# Patient Record
Sex: Female | Born: 1960
Health system: Southern US, Community
[De-identification: ages and names within clinical notes are randomized; demographics above are authoritative.]

---

## 2004-09-28 ENCOUNTER — Other Ambulatory Visit: Admission: RE | Admit: 2004-09-28 | Discharge: 2004-09-28 | Payer: Self-pay | Admitting: Family Medicine

## 2006-06-01 ENCOUNTER — Other Ambulatory Visit: Admission: RE | Admit: 2006-06-01 | Discharge: 2006-06-01 | Payer: Self-pay | Admitting: Family Medicine

## 2007-07-25 ENCOUNTER — Other Ambulatory Visit: Admission: RE | Admit: 2007-07-25 | Discharge: 2007-07-25 | Payer: Self-pay | Admitting: Family Medicine

## 2008-08-21 ENCOUNTER — Encounter: Admission: RE | Admit: 2008-08-21 | Discharge: 2008-08-21 | Payer: Self-pay | Admitting: Family Medicine

## 2008-09-01 ENCOUNTER — Encounter: Admission: RE | Admit: 2008-09-01 | Discharge: 2008-09-01 | Payer: Self-pay | Admitting: Family Medicine

## 2008-10-07 ENCOUNTER — Other Ambulatory Visit: Admission: RE | Admit: 2008-10-07 | Discharge: 2008-10-07 | Payer: Self-pay | Admitting: Family Medicine

## 2010-05-07 ENCOUNTER — Encounter: Admission: RE | Admit: 2010-05-07 | Discharge: 2010-05-07 | Payer: Self-pay | Admitting: Internal Medicine

## 2010-11-14 ENCOUNTER — Encounter: Payer: Self-pay | Admitting: Family Medicine

## 2011-07-22 ENCOUNTER — Other Ambulatory Visit: Payer: Self-pay | Admitting: Family Medicine

## 2011-07-22 DIAGNOSIS — Z1231 Encounter for screening mammogram for malignant neoplasm of breast: Secondary | ICD-10-CM

## 2011-08-05 ENCOUNTER — Ambulatory Visit
Admission: RE | Admit: 2011-08-05 | Discharge: 2011-08-05 | Disposition: A | Discharge status: Home / Self Care | Payer: BC Managed Care – PPO | Source: Ambulatory Visit | Attending: Family Medicine | Admitting: Family Medicine

## 2011-08-05 DIAGNOSIS — Z1231 Encounter for screening mammogram for malignant neoplasm of breast: Secondary | ICD-10-CM

## 2012-07-24 ENCOUNTER — Other Ambulatory Visit: Payer: Self-pay | Admitting: Family Medicine

## 2012-07-24 DIAGNOSIS — N644 Mastodynia: Secondary | ICD-10-CM

## 2012-08-06 ENCOUNTER — Ambulatory Visit
Admission: RE | Admit: 2012-08-06 | Discharge: 2012-08-06 | Disposition: A | Discharge status: Home / Self Care | Payer: BC Managed Care – PPO | Source: Ambulatory Visit | Attending: Family Medicine | Admitting: Family Medicine

## 2012-08-06 DIAGNOSIS — N644 Mastodynia: Secondary | ICD-10-CM

## 2013-07-04 ENCOUNTER — Other Ambulatory Visit: Payer: Self-pay | Admitting: Obstetrics and Gynecology

## 2013-07-04 ENCOUNTER — Other Ambulatory Visit (HOSPITAL_COMMUNITY)
Admission: RE | Admit: 2013-07-04 | Discharge: 2013-07-04 | Disposition: A | Discharge status: Home / Self Care | Payer: BC Managed Care – PPO | Source: Ambulatory Visit | Attending: Obstetrics and Gynecology | Admitting: Obstetrics and Gynecology

## 2013-07-04 DIAGNOSIS — Z01419 Encounter for gynecological examination (general) (routine) without abnormal findings: Secondary | ICD-10-CM | POA: Insufficient documentation

## 2013-07-04 DIAGNOSIS — Z1151 Encounter for screening for human papillomavirus (HPV): Secondary | ICD-10-CM | POA: Insufficient documentation

## 2013-07-05 ENCOUNTER — Other Ambulatory Visit: Payer: Self-pay | Admitting: Dermatology

## 2013-08-12 ENCOUNTER — Other Ambulatory Visit: Payer: Self-pay | Admitting: Obstetrics and Gynecology

## 2013-08-19 ENCOUNTER — Other Ambulatory Visit: Payer: Self-pay

## 2013-08-19 DIAGNOSIS — Z1231 Encounter for screening mammogram for malignant neoplasm of breast: Secondary | ICD-10-CM

## 2013-08-20 ENCOUNTER — Ambulatory Visit
Admission: RE | Admit: 2013-08-20 | Discharge: 2013-08-20 | Disposition: A | Discharge status: Home / Self Care | Payer: BC Managed Care – PPO | Source: Ambulatory Visit

## 2013-08-20 DIAGNOSIS — Z1231 Encounter for screening mammogram for malignant neoplasm of breast: Secondary | ICD-10-CM

## 2014-06-25 ENCOUNTER — Other Ambulatory Visit: Payer: Self-pay | Admitting: Family Medicine

## 2014-06-25 DIAGNOSIS — N63 Unspecified lump in unspecified breast: Secondary | ICD-10-CM

## 2014-07-03 ENCOUNTER — Ambulatory Visit
Admission: RE | Admit: 2014-07-03 | Discharge: 2014-07-03 | Disposition: A | Discharge status: Home / Self Care | Payer: BC Managed Care – PPO | Source: Ambulatory Visit | Attending: Family Medicine | Admitting: Family Medicine

## 2014-07-03 DIAGNOSIS — N63 Unspecified lump in unspecified breast: Secondary | ICD-10-CM

## 2015-06-09 ENCOUNTER — Emergency Department (HOSPITAL_COMMUNITY): Payer: BLUE CROSS/BLUE SHIELD

## 2015-06-09 ENCOUNTER — Encounter (HOSPITAL_COMMUNITY): Payer: Self-pay | Admitting: Emergency Medicine

## 2015-06-09 ENCOUNTER — Emergency Department (HOSPITAL_COMMUNITY)
Admission: EM | Admit: 2015-06-09 | Discharge: 2015-06-09 | Disposition: A | Discharge status: Home / Self Care | Payer: BLUE CROSS/BLUE SHIELD | Attending: Emergency Medicine | Admitting: Emergency Medicine

## 2015-06-09 DIAGNOSIS — R42 Dizziness and giddiness: Secondary | ICD-10-CM | POA: Diagnosis present

## 2015-06-09 DIAGNOSIS — G510 Bell's palsy: Secondary | ICD-10-CM | POA: Diagnosis not present

## 2015-06-09 LAB — CBC
HCT: 38.7 % (ref 36.0–46.0)
Hemoglobin: 12.8 g/dL (ref 12.0–15.0)
MCH: 33.3 pg (ref 26.0–34.0)
MCHC: 33.1 g/dL (ref 30.0–36.0)
MCV: 100.8 fL — ABNORMAL HIGH (ref 78.0–100.0)
Platelets: 248 10*3/uL (ref 150–400)
RBC: 3.84 MIL/uL — ABNORMAL LOW (ref 3.87–5.11)
RDW: 14.3 % (ref 11.5–15.5)
WBC: 4.4 10*3/uL (ref 4.0–10.5)

## 2015-06-09 LAB — COMPREHENSIVE METABOLIC PANEL
ALT: 22 U/L (ref 14–54)
AST: 34 U/L (ref 15–41)
Albumin: 4.2 g/dL (ref 3.5–5.0)
Alkaline Phosphatase: 43 U/L (ref 38–126)
Anion gap: 5 (ref 5–15)
BUN: 18 mg/dL (ref 6–20)
CO2: 31 mmol/L (ref 22–32)
Calcium: 9.1 mg/dL (ref 8.9–10.3)
Chloride: 102 mmol/L (ref 101–111)
Creatinine, Ser: 0.78 mg/dL (ref 0.44–1.00)
GFR calc Af Amer: >60 mL/min (ref >60)
GFR calc non Af Amer: >60 mL/min (ref >60)
Glucose, Bld: 90 mg/dL (ref 65–99)
Potassium: 3.8 mmol/L (ref 3.5–5.1)
Sodium: 138 mmol/L (ref 135–145)
Total Bilirubin: 0.9 mg/dL (ref 0.3–1.2)
Total Protein: 6.5 g/dL (ref 6.5–8.1)

## 2015-06-09 LAB — URINALYSIS, ROUTINE W REFLEX MICROSCOPIC
Bilirubin Urine: NEGATIVE
Glucose, UA: NEGATIVE mg/dL
Hgb urine dipstick: NEGATIVE
Ketones, ur: NEGATIVE mg/dL
Leukocytes, UA: NEGATIVE
Nitrite: NEGATIVE
Protein, ur: NEGATIVE mg/dL
Specific Gravity, Urine: 1.004 — ABNORMAL LOW (ref 1.005–1.030)
Urobilinogen, UA: 0.2 mg/dL (ref 0.0–1.0)
pH: 6 (ref 5.0–8.0)

## 2015-06-09 LAB — CBG MONITORING, ED: Glucose-Capillary: 62 mg/dL — ABNORMAL LOW (ref 65–99)

## 2015-06-09 NOTE — ED Notes (Signed)
Pt sts that she has had some vertigo for over a week and woke this am and had some droopiness over her R eye. Pt has not neuro deficients. Pt does have slight droop to her R smile and eye that stated last week. Pt sts that she woke this am with a HA. Pt also has some neck pain. No known trauma. Equal R=L grip and strength R = L.

## 2015-06-09 NOTE — Discharge Instructions (Signed)
Bell's Palsy °Bell's palsy is a condition in which the muscles on one side of the face cannot move (paralysis). This is because the nerves in the face are paralyzed. It is most often thought to be caused by a virus. The virus causes swelling of the nerve that controls movement on one side of the face. The nerve travels through a tight space surrounded by bone. When the nerve swells, it can be compressed by the bone. This results in damage to the protective covering around the nerve. This damage interferes with how the nerve communicates with the muscles of the face. As a result, it can cause weakness or paralysis of the facial muscles.  °Injury (trauma), tumor, and surgery may cause Bell's palsy, but most of the time the cause is unknown. It is a relatively common condition. It starts suddenly (abrupt onset) with the paralysis usually ending within 2 days. Bell's palsy is not dangerous. But because the eye does not close properly, you may need care to keep the eye from getting dry. This can include splinting (to keep the eye shut) or moistening with artificial tears. Bell's palsy very seldom occurs on both sides of the face at the same time. °SYMPTOMS  °· Eyebrow sagging. °· Drooping of the eyelid and corner of the mouth. °· Inability to close one eye. °· Loss of taste on the front of the tongue. °· Sensitivity to loud noises. °TREATMENT  °The treatment is usually non-surgical. If the patient is seen within the first 24 to 48 hours, a short course of steroids may be prescribed, in an attempt to shorten the length of the condition. Antiviral medicines may also be used with the steroids, but it is unclear if they are helpful.  °You will need to protect your eye, if you cannot close it. The cornea (clear covering over your eye) will become dry and can be damaged. Artificial tears can be used to keep your eye moist. Glasses or an eye patch should be worn to protect your eye. °PROGNOSIS  °Recovery is variable, ranging  from days to months. Although the problem usually goes away completely (about 80% of cases resolve), predicting the outcome is impossible. Most people improve within 3 weeks of when the symptoms began. Improvement may continue for 3 to 6 months. A small number of people have moderate to severe weakness that is permanent.  °HOME CARE INSTRUCTIONS  °· If your caregiver prescribed medication to reduce swelling in the nerve, use as directed. Do not stop taking the medication unless directed by your caregiver. °· Use moisturizing eye drops as needed to prevent drying of your eye, as directed by your caregiver. °· Protect your eye, as directed by your caregiver. °· Use facial massage and exercises, as directed by your caregiver. °· Perform your normal activities, and get your normal rest. °SEEK IMMEDIATE MEDICAL CARE IF:  °· There is pain, redness or irritation in the eye. °· You or your child has an oral temperature above 102° F (38.9° C), not controlled by medicine. °MAKE SURE YOU:  °· Understand these instructions. °· Will watch your condition. °· Will get help right away if you are not doing well or get worse. °Document Released: 10/10/2005 Document Revised: 01/02/2012 Document Reviewed: 01/17/2014 °[employer]® Patient Information ©2015 [employer]. This information is not intended to replace advice given to you by your health care provider. Make sure you discuss any questions you have with your health care provider. ° °

## 2015-06-18 NOTE — ED Provider Notes (Signed)
CSN: [id number]     Arrival date & time 06/09/15  5329 History   First MD Initiated Contact with Patient 06/09/15 1028     Chief Complaint  Patient presents with  . Dizziness     (Consider location/radiation/quality/duration/timing/severity/associated sxs/prior Treatment) HPI   54 year old female with right eye drooping. First noticed much this morning. Last seen normal when she went to bed last night. Time of presentation over 12 hours from last seen normal. Patient has been having some facial pressure and vertiginous symptoms in the past week. Mild headache. No acute visual changes. No acute numbness, tingling or focal loss of strength otherwise. No history similar type symptoms. No fevers or chills. No difficulty breathing or swallowing. No change in taste or hearing.  History reviewed. No pertinent past medical history. History reviewed. No pertinent past surgical history. History reviewed. No pertinent family history. Social History  Substance Use Topics  . Smoking status: Never Smoker   . Smokeless tobacco: Never Used  . Alcohol Use: Yes   OB History    No data available     Review of Systems  All systems reviewed and negative, other than as noted in HPI.   Allergies  Review of patient's allergies indicates no known allergies.  Home Medications   Prior to Admission medications   Medication Sig Start Date End Date Taking? Authorizing Provider  azelastine (ASTELIN) 0.1 % nasal spray Place 1 spray into both nostrils 2 (two) times daily. Use in each nostril as directed   Yes Historical Provider, MD  naproxen sodium (ANAPROX) 220 MG tablet Take 440 mg by mouth 2 (two) times daily as needed (neck pain headaches).   Yes Historical Provider, MD   BP 105/72 mmHg  Pulse 61  Temp(Src) 98.8 F (37.1 C) (Oral)  Resp 13  SpO2 99%  LMP 02/01/2015 Physical Exam  Constitutional: She appears well-developed and well-nourished. No distress.  HENT:  Head: Normocephalic and  atraumatic.  Eyes: Conjunctivae are normal. Right eye exhibits no discharge. Left eye exhibits no discharge.  Neck: Neck supple.  Cardiovascular: Normal rate, regular rhythm and normal heart sounds.  Exam reveals no gallop and no friction rub.   No murmur heard. Pulmonary/Chest: Effort normal and breath sounds normal. No respiratory distress.  Abdominal: Soft. She exhibits no distension. There is no tenderness.  Musculoskeletal: She exhibits no edema or tenderness.  Neurological: She is alert.  Mild right eye ptosis. Cranial nerves II through XII are otherwise intact. Smile seem symmetric. Forehead seems to wrinkle symmetrically. Tongue is midline. Extraocular muscle function is normal. Strength is 5 out of 5 bilateral upper lower extremities. Sensation is intact to light touch. Consider urinalysis bilaterally. Gait is steady. Speech is clear. Content appropriate.  Skin: Skin is warm and dry.  Psychiatric: She has a normal mood and affect. Her behavior is normal. Thought content normal.  Nursing note and vitals reviewed.   ED Course  Procedures (including critical care time) Labs Review Labs Reviewed  CBC - Abnormal; Notable for the following:    RBC 3.84 (*)    MCV 100.8 (*)    All other components within normal limits  URINALYSIS, ROUTINE W REFLEX MICROSCOPIC (NOT AT [facility]) - Abnormal; Notable for the following:    Specific Gravity, Urine 1.004 (*)    All other components within normal limits  CBG MONITORING, ED - Abnormal; Notable for the following:    Glucose-Capillary 62 (*)    All other components within normal limits  COMPREHENSIVE METABOLIC PANEL  Imaging Review No results found.   Ct Head Wo Contrast  06/09/2015   CLINICAL DATA:  Headache beginning at base of skull and radiating up and behind right eye since yesterday. Right eye droop.  EXAM: CT HEAD WITHOUT CONTRAST  TECHNIQUE: Contiguous axial images were obtained from the base of the skull through the vertex without  intravenous contrast.  COMPARISON:  None.  FINDINGS: No acute intracranial abnormality. Specifically, no hemorrhage, hydrocephalus, mass lesion, acute infarction, or significant intracranial injury. No acute calvarial abnormality.  Mucosal thickening in the maxillary sinuses and ethmoid air cells. Mastoid air cells are clear.  IMPRESSION: No acute intracranial abnormality.  Chronic sinusitis.   Electronically Signed   By: Rolm Baptise M.D.   On: 06/09/2015 11:47   Mr Brain Wo Contrast  06/09/2015   CLINICAL DATA:  54 year old female with vertigo x1 week, and new onset right facial droop/weakness. Initial encounter.  EXAM: MRI HEAD WITHOUT CONTRAST  TECHNIQUE: Multiplanar, multiecho pulse sequences of the brain and surrounding structures were obtained without intravenous contrast.  COMPARISON:  Head CT without contrast 1133 hr today.  FINDINGS: No restricted diffusion to suggest acute infarction. No midline shift, mass effect, evidence of mass lesion, ventriculomegaly, extra-axial collection or acute intracranial hemorrhage. Cervicomedullary junction and pituitary are within normal limits. Major intracranial vascular flow voids are preserved, dominant appearing distal left and diminutive distal right vertebral artery is. Negative visualized cervical spine. Cerebral volume is within normal limits for age.  Pearline Cables and white matter signal is within normal limits for age throughout the brain. No cortical encephalomalacia or chronic cerebral blood products. Deep gray matter nuclei, brainstem, and cerebellum are within normal limits. Visible internal auditory structures appear normal. Normal stylomastoid foramina.  Mastoids are clear. Moderate paranasal sinus mucosal thickening bilaterally. Small fluid level in the right maxillary sinus. Orbit and scalp soft tissues are within normal limits. Normal bone marrow signal.  IMPRESSION: 1. Normal for age noncontrast MRI appearance of the brain. 2. Moderate paranasal sinus  inflammation.   Electronically Signed   By: Genevie Ann M.D.   On: 06/09/2015 14:05   I have personally reviewed and evaluated these images and lab results as part of my medical decision-making.   EKG Interpretation   Date/Time:  Tuesday June 09 2015 11:20:46 EDT Ventricular Rate:  53 PR Interval:  146 QRS Duration: 79 QT Interval:  488 QTC Calculation: 458 R Axis:   61 Text Interpretation:  Sinus rhythm No old tracing to compare Confirmed by  Amberle Lyter  MD, Alexander ([id number]) on 06/09/2015 12:20:45 PM      MDM   Final diagnoses:  Bell's palsy    54 year old female with mild right ptosis. Exam not convincing for. Seventh cranial nerve palsy. Exam is otherwise nonfocal. CT and MRI subsequently unremarkable though. Possible some facial puffiness from sinusitis?. Generally low suspicion for CVA or other serious acute process.    Virgel Manifold, MD 06/18/15 1515

## 2015-12-24 ENCOUNTER — Other Ambulatory Visit: Payer: Self-pay | Admitting: Family Medicine

## 2015-12-24 ENCOUNTER — Other Ambulatory Visit (HOSPITAL_COMMUNITY)
Admission: RE | Admit: 2015-12-24 | Discharge: 2015-12-24 | Disposition: A | Discharge status: Home / Self Care | Payer: BLUE CROSS/BLUE SHIELD | Source: Ambulatory Visit | Attending: Family Medicine | Admitting: Family Medicine

## 2015-12-24 DIAGNOSIS — Z124 Encounter for screening for malignant neoplasm of cervix: Secondary | ICD-10-CM | POA: Insufficient documentation

## 2015-12-28 LAB — CYTOLOGY - PAP

## 2017-04-05 DIAGNOSIS — H11153 Pinguecula, bilateral: Secondary | ICD-10-CM | POA: Diagnosis not present

## 2017-04-05 DIAGNOSIS — G43809 Other migraine, not intractable, without status migrainosus: Secondary | ICD-10-CM | POA: Diagnosis not present

## 2017-04-05 DIAGNOSIS — H2513 Age-related nuclear cataract, bilateral: Secondary | ICD-10-CM | POA: Diagnosis not present

## 2017-05-26 DIAGNOSIS — R0602 Shortness of breath: Secondary | ICD-10-CM | POA: Diagnosis not present

## 2017-05-26 DIAGNOSIS — R6889 Other general symptoms and signs: Secondary | ICD-10-CM | POA: Diagnosis not present

## 2017-06-21 DIAGNOSIS — J3089 Other allergic rhinitis: Secondary | ICD-10-CM | POA: Diagnosis not present

## 2017-06-21 DIAGNOSIS — J301 Allergic rhinitis due to pollen: Secondary | ICD-10-CM | POA: Diagnosis not present

## 2017-06-21 DIAGNOSIS — J45990 Exercise induced bronchospasm: Secondary | ICD-10-CM | POA: Diagnosis not present

## 2017-10-04 DIAGNOSIS — J3089 Other allergic rhinitis: Secondary | ICD-10-CM | POA: Diagnosis not present

## 2017-10-04 DIAGNOSIS — J0100 Acute maxillary sinusitis, unspecified: Secondary | ICD-10-CM | POA: Diagnosis not present

## 2017-10-04 DIAGNOSIS — J45990 Exercise induced bronchospasm: Secondary | ICD-10-CM | POA: Diagnosis not present

## 2017-10-04 DIAGNOSIS — J301 Allergic rhinitis due to pollen: Secondary | ICD-10-CM | POA: Diagnosis not present

## 2017-12-14 ENCOUNTER — Ambulatory Visit
Admission: RE | Admit: 2017-12-14 | Discharge: 2017-12-14 | Disposition: A | Discharge status: Home / Self Care | Payer: BLUE CROSS/BLUE SHIELD | Source: Ambulatory Visit | Attending: Family Medicine | Admitting: Family Medicine

## 2017-12-14 ENCOUNTER — Other Ambulatory Visit: Payer: Self-pay | Admitting: Family Medicine

## 2017-12-14 DIAGNOSIS — Z139 Encounter for screening, unspecified: Secondary | ICD-10-CM

## 2017-12-14 DIAGNOSIS — Z1231 Encounter for screening mammogram for malignant neoplasm of breast: Secondary | ICD-10-CM | POA: Diagnosis not present

## 2018-12-27 ENCOUNTER — Emergency Department (HOSPITAL_COMMUNITY)
Admission: EM | Admit: 2018-12-27 | Discharge: 2018-12-27 | Disposition: A | Discharge status: Home / Self Care | Payer: 59 | Attending: Emergency Medicine | Admitting: Emergency Medicine

## 2018-12-27 ENCOUNTER — Encounter (HOSPITAL_COMMUNITY): Payer: Self-pay | Admitting: Emergency Medicine

## 2018-12-27 ENCOUNTER — Emergency Department (HOSPITAL_COMMUNITY): Payer: 59

## 2018-12-27 ENCOUNTER — Other Ambulatory Visit: Payer: Self-pay

## 2018-12-27 DIAGNOSIS — R0789 Other chest pain: Secondary | ICD-10-CM | POA: Insufficient documentation

## 2018-12-27 DIAGNOSIS — Z79899 Other long term (current) drug therapy: Secondary | ICD-10-CM | POA: Insufficient documentation

## 2018-12-27 DIAGNOSIS — R079 Chest pain, unspecified: Secondary | ICD-10-CM | POA: Diagnosis present

## 2018-12-27 LAB — COMPREHENSIVE METABOLIC PANEL
ALK PHOS: 72 U/L (ref 38–126)
ALT: 20 U/L (ref 0–44)
ANION GAP: 9 (ref 5–15)
AST: 38 U/L (ref 15–41)
Albumin: 4.6 g/dL (ref 3.5–5.0)
BILIRUBIN TOTAL: 0.7 mg/dL (ref 0.3–1.2)
BUN: 21 mg/dL — ABNORMAL HIGH (ref 6–20)
CALCIUM: 9.2 mg/dL (ref 8.9–10.3)
CO2: 27 mmol/L (ref 22–32)
Chloride: 102 mmol/L (ref 98–111)
Creatinine, Ser: 0.86 mg/dL (ref 0.44–1.00)
Glucose, Bld: 96 mg/dL (ref 70–99)
Potassium: 4.1 mmol/L (ref 3.5–5.1)
Sodium: 138 mmol/L (ref 135–145)
TOTAL PROTEIN: 7.2 g/dL (ref 6.5–8.1)

## 2018-12-27 LAB — I-STAT BETA HCG BLOOD, ED (MC, WL, AP ONLY)

## 2018-12-27 LAB — CBC WITH DIFFERENTIAL/PLATELET
ABS IMMATURE GRANULOCYTES: 0.02 10*3/uL (ref 0.00–0.07)
Basophils Absolute: 0.1 10*3/uL (ref 0.0–0.1)
Basophils Relative: 1 %
EOS ABS: 0.2 10*3/uL (ref 0.0–0.5)
EOS PCT: 4 %
HEMATOCRIT: 43 % (ref 36.0–46.0)
HEMOGLOBIN: 14.2 g/dL (ref 12.0–15.0)
Immature Granulocytes: 0 %
Lymphocytes Relative: 33 %
Lymphs Abs: 2 10*3/uL (ref 0.7–4.0)
MCH: 33.3 pg (ref 26.0–34.0)
MCHC: 33 g/dL (ref 30.0–36.0)
MCV: 100.9 fL — AB (ref 80.0–100.0)
Monocytes Absolute: 0.7 10*3/uL (ref 0.1–1.0)
Monocytes Relative: 11 %
NEUTROS PCT: 51 %
NRBC: 0 % (ref 0.0–0.2)
Neutro Abs: 3.1 10*3/uL (ref 1.7–7.7)
Platelets: 248 10*3/uL (ref 150–400)
RBC: 4.26 MIL/uL (ref 3.87–5.11)
RDW: 13.4 % (ref 11.5–15.5)
WBC: 6.1 10*3/uL (ref 4.0–10.5)

## 2018-12-27 LAB — I-STAT TROPONIN, ED
Troponin i, poc: 0 ng/mL (ref 0.00–0.08)
Troponin i, poc: 0 ng/mL (ref 0.00–0.08)

## 2018-12-27 LAB — I-STAT CREATININE, ED: CREATININE: 0.9 mg/dL (ref 0.44–1.00)

## 2018-12-27 LAB — LIPASE, BLOOD: Lipase: 49 U/L (ref 11–51)

## 2018-12-27 MED ORDER — SODIUM CHLORIDE (PF) 0.9 % IJ SOLN
INTRAMUSCULAR | Status: AC
  Filled 2018-12-27: qty 50

## 2018-12-27 MED ORDER — FAMOTIDINE 20 MG PO TABS
20.0000 mg | ORAL_TABLET | Freq: Once | ORAL | Status: AC
  Administered 2018-12-27: 20 mg via ORAL
  Filled 2018-12-27: qty 1

## 2018-12-27 MED ORDER — CYCLOBENZAPRINE HCL 5 MG PO TABS: 5.0000 mg | ORAL_TABLET | Freq: Three times a day (TID) | ORAL | 0 refills | Status: AC | PRN

## 2018-12-27 MED ORDER — FENTANYL CITRATE (PF) 100 MCG/2ML IJ SOLN
50.0000 ug | Freq: Once | INTRAMUSCULAR | Status: AC
  Administered 2018-12-27: 50 ug via INTRAVENOUS
  Filled 2018-12-27: qty 2

## 2018-12-27 MED ORDER — IOHEXOL 350 MG/ML SOLN
100.0000 mL | Freq: Once | INTRAVENOUS | Status: AC | PRN
  Administered 2018-12-27: 100 mL via INTRAVENOUS

## 2018-12-27 MED ORDER — SODIUM CHLORIDE 0.9% FLUSH
3.0000 mL | Freq: Once | INTRAVENOUS | Status: AC
  Administered 2018-12-27: 3 mL via INTRAVENOUS

## 2018-12-27 MED ORDER — ALUM & MAG HYDROXIDE-SIMETH 200-200-20 MG/5ML PO SUSP
30.0000 mL | Freq: Once | ORAL | Status: AC
  Administered 2018-12-27: 30 mL via ORAL
  Filled 2018-12-27: qty 30

## 2018-12-27 MED ORDER — CYCLOBENZAPRINE HCL 10 MG PO TABS
5.0000 mg | ORAL_TABLET | Freq: Once | ORAL | Status: AC
  Administered 2018-12-27: 5 mg via ORAL
  Filled 2018-12-27: qty 1

## 2018-12-27 MED ORDER — LIDOCAINE VISCOUS HCL 2 % MT SOLN
15.0000 mL | Freq: Once | OROMUCOSAL | Status: AC
Start: 2018-12-27 — End: 2018-12-27
  Administered 2018-12-27: 15 mL via ORAL
  Filled 2018-12-27: qty 15

## 2018-12-27 NOTE — ED Triage Notes (Signed)
Patient from home with central chest pain starting about 50 min ago. Pt states it has gotten progressively worse. Described as pressure. Denies n/v.

## 2018-12-27 NOTE — Discharge Instructions (Signed)
Take tylenol, motrin as needed for pain   Take flexeril for muscle strain.   No heavy lifting for 2-3 days   See your doctor. Get stress test in a week if you still have pain   Return to ER if you have worse chest pain, trouble breathing, shortness of breath

## 2018-12-27 NOTE — ED Provider Notes (Signed)
[facility] DEPT Provider Note   CSN: [id number] Arrival date & time: 12/27/18  1046    History   Chief Complaint Chief Complaint  Patient presents with  . Chest Pain    HPI Gwendolyn Holden is a 58 y.o. female here presenting with chest pain.  Patient states that about an hour prior to arrival, she was doing some laundry and had sudden onset of substernal chest pain.  States that it is associated with some subjective shortness of breath and the pain does radiate to the left side of her back as well as neck area.  Patient denies any recent travel or history of blood clots.  Denies any history of coronary artery disease. She does have family hx of MI.      The history is provided by the patient.    History reviewed. No pertinent past medical history.  There are no active problems to display for this patient.   History reviewed. No pertinent surgical history.   OB History   No obstetric history on file.      Home Medications    Prior to Admission medications   Medication Sig Start Date End Date Taking? Authorizing Provider  ARNUITY ELLIPTA 100 MCG/ACT AEPB Inhale 1 puff into the lungs at bedtime. 12/14/18  Yes [provider]  aspirin 325 MG tablet Take 650 mg by mouth daily as needed for mild pain.   Yes [provider]  Multiple Vitamin (MULTIVITAMIN WITH MINERALS) TABS tablet Take 1 tablet by mouth daily.   Yes [provider]    Family History History reviewed. No pertinent family history.  Social History Social History   Tobacco Use  . Smoking status: Never Smoker  . Smokeless tobacco: Never Used  Substance Use Topics  . Alcohol use: Yes  . Drug use: No     Allergies   Patient has no known allergies.   Review of Systems Review of Systems  Cardiovascular: Positive for chest pain.  All other systems reviewed and are negative.    Physical Exam Updated Vital Signs BP 120/84   Pulse 87    Temp (!) 97.5 F (36.4 C) (Oral)   Resp 16   Ht 5\' 4"  (1.626 m)   Wt 53.5 kg   LMP 02/01/2015 Comment: recent test does not prove perimenopause; pt states no chance of pregnancy  SpO2 100%   BMI 20.25 kg/m   Physical Exam Vitals signs and nursing note reviewed.  HENT:     Head: Normocephalic.  Eyes:     Extraocular Movements: Extraocular movements intact.     Pupils: Pupils are equal, round, and reactive to light.  Neck:     Musculoskeletal: Normal range of motion.  Cardiovascular:     Rate and Rhythm: Normal rate and regular rhythm.     Heart sounds: Normal heart sounds.  Pulmonary:     Effort: Pulmonary effort is normal.     Breath sounds: Normal breath sounds.  Chest:     Comments: No reproducible tenderness  Abdominal:     General: Bowel sounds are normal.     Palpations: Abdomen is soft.  Musculoskeletal: Normal range of motion.  Skin:    General: Skin is warm.     Capillary Refill: Capillary refill takes less than 2 seconds.  Neurological:     General: No focal deficit present.     Mental Status: She is alert and oriented to person, place, and time.  Psychiatric:  Mood and Affect: Mood normal.        Behavior: Behavior normal.      ED Treatments / Results  Labs (all labs ordered are listed, but only abnormal results are displayed) Labs Reviewed  CBC WITH DIFFERENTIAL/PLATELET - Abnormal; Notable for the following components:      Result Value   MCV 100.9 (*)    All other components within normal limits  COMPREHENSIVE METABOLIC PANEL - Abnormal; Notable for the following components:   BUN 21 (*)    All other components within normal limits  LIPASE, BLOOD  I-STAT TROPONIN, ED  I-STAT BETA HCG BLOOD, ED ([facility], [facility], [facility] ONLY)  I-STAT CREATININE, ED  I-STAT TROPONIN, ED    EKG EKG Interpretation  Date/Time:  Thursday December 27 2018 10:54:46 EST Ventricular Rate:  87 PR Interval:    QRS Duration: 87 QT Interval:  391 QTC Calculation: 471 R  Axis:   55 Text Interpretation:  Sinus rhythm Probable left atrial enlargement No significant change since last tracing Confirmed by Wandra Arthurs [phone]) on 12/27/2018 11:09:25 AM Also confirmed by Wandra Arthurs [phone]), editor Philomena Doheny [id number])  on 12/27/2018 12:18:28 PM   Radiology Dg Chest 2 View  Result Date: 12/27/2018 CLINICAL DATA:  Chest pain. EXAM: CHEST - 2 VIEW COMPARISON:  None. FINDINGS: The cardiomediastinal silhouette is within normal limits. The lungs are well inflated and clear. There is no evidence of pleural effusion or pneumothorax. No acute osseous abnormality is identified. IMPRESSION: No active cardiopulmonary disease. Electronically Signed   By: Logan Bores M.D.   On: 12/27/2018 12:25   Ct Angio Chest/abd/pel For Dissection W And/or Wo Contrast  Result Date: 12/27/2018 CLINICAL DATA:  Acute central chest and back pain, concern for dissection EXAM: CT ANGIOGRAPHY CHEST, ABDOMEN AND PELVIS TECHNIQUE: Multidetector CT imaging through the chest, abdomen and pelvis was performed using the standard protocol during bolus administration of intravenous contrast. Multiplanar reconstructed images and MIPs were obtained and reviewed to evaluate the vascular anatomy. CONTRAST:  188mL OMNIPAQUE IOHEXOL 350 MG/ML SOLN COMPARISON:  12/27/2026 chest x-ray FINDINGS: CTA CHEST FINDINGS Cardiovascular: Preferential opacification of the thoracic aorta. No evidence of thoracic aortic aneurysm or dissection. Normal heart size. No pericardial effusion. Visualized pulmonary arteries are also patent. No significant filling defect or pulmonary embolus. Mediastinum/Nodes: No enlarged mediastinal, hilar, or axillary lymph nodes. Thyroid gland, trachea, and esophagus demonstrate no significant findings. Lungs/Pleura: Lungs are clear. No pleural effusion or pneumothorax. Musculoskeletal: No chest wall abnormality. No acute or significant osseous findings. Review of the MIP images confirms the above findings. CTA  ABDOMEN AND PELVIS FINDINGS VASCULAR Aorta: Normal caliber aorta without aneurysm, dissection, vasculitis or significant stenosis. Celiac: Widely patent including its branches SMA: Widely patent including its branches Renals: Both renal arteries are widely patent. IMA: Remains patent off the distal aorta including its branches Inflow: Widely patent pelvic iliac vasculature. The common, internal and external iliac arteries are patent. No inflow disease. Veins: Venous phase imaging not performed Review of the MIP images confirms the above findings. NON-VASCULAR Hepatobiliary: No focal abnormality for arterial phase imaging. Gallbladder nondistended. No biliary dilatation obstruction. Common bile duct nondilated. Pancreas: Unremarkable. No pancreatic ductal dilatation or surrounding inflammatory changes. Spleen: Normal in size without focal abnormality. Adrenals/Urinary Tract: Left adrenal hypodense nodule compatible with small adenoma measures 1.8 cm. Normal right adrenal gland. No focal renal abnormality, obstruction pattern or hydronephrosis. No hydroureter or ureteral calculus. Bladder is collapsed. Stomach/Bowel: Negative for bowel obstruction, significant dilatation, ileus,  free air. Appendix not well visualized. No acute inflammatory process in the right lower quadrant. No abdominopelvic free fluid, fluid collection, hemorrhage, abscess, or hematoma. Lymphatic: No bulky adenopathy. Reproductive: Uterus and bilateral adnexa are unremarkable. Other: No abdominal wall hernia or abnormality. No abdominopelvic ascites. Musculoskeletal: No acute or significant osseous findings. Review of the MIP images confirms the above findings. IMPRESSION: Intact aorta. Negative for acute dissection or other acute vascular finding in the chest abdomen or pelvis. No other acute intrathoracic process No other acute abdominopelvic finding by CTA Electronically Signed   By: Jerilynn Mages.  Shick M.D.   On: 12/27/2018 13:22     Procedures Procedures (including critical care time)  Medications Ordered in ED Medications  sodium chloride (PF) 0.9 % injection (has no administration in time range)  sodium chloride flush (NS) 0.9 % injection 3 mL (3 mLs Intravenous Given 12/27/18 1134)  fentaNYL (SUBLIMAZE) injection 50 mcg (50 mcg Intravenous Given 12/27/18 1224)  iohexol (OMNIPAQUE) 350 MG/ML injection 100 mL (100 mLs Intravenous Contrast Given 12/27/18 1247)  alum & mag hydroxide-simeth (MAALOX/MYLANTA) 200-200-20 MG/5ML suspension 30 mL (30 mLs Oral Given 12/27/18 1411)    And  lidocaine (XYLOCAINE) 2 % viscous mouth solution 15 mL (15 mLs Oral Given 12/27/18 1412)  famotidine (PEPCID) tablet 20 mg (20 mg Oral Given 12/27/18 1411)  cyclobenzaprine (FLEXERIL) tablet 5 mg (5 mg Oral Given 12/27/18 1411)     Initial Impression / Assessment and Plan / ED Course  I have reviewed the triage vital signs and the nursing notes.  Pertinent labs & imaging results that were available during my care of the patient were reviewed by me and considered in my medical decision making (see chart for details).       Gwendolyn Holden is a 58 y.o. female here with chest pain, back pain. Patient hypertensive in the ED. Sudden onset chest and back pain. Consider dissection vs PE. Low risk for ACS so will get trop x 2. Will get labs, dissection study, trop x 2.   2:39 PM Trop neg x 2. Dissection study negative. Patient states that she ran 6 miles yesterday. I suspect some muscle strain vs GI. Given flexeril, GI cocktail. Stable for discharge. Recommend stress test in a week if she still doesn't feel better    Final Clinical Impressions(s) / ED Diagnoses   Final diagnoses:  None    ED Discharge Orders    None       Drenda Freeze, MD 12/27/18 1440

## 2019-06-11 ENCOUNTER — Other Ambulatory Visit: Payer: Self-pay | Admitting: Family Medicine

## 2019-06-11 DIAGNOSIS — Z1231 Encounter for screening mammogram for malignant neoplasm of breast: Secondary | ICD-10-CM

## 2019-06-11 DIAGNOSIS — D1801 Hemangioma of skin and subcutaneous tissue: Secondary | ICD-10-CM | POA: Diagnosis not present

## 2019-06-13 ENCOUNTER — Other Ambulatory Visit: Payer: Self-pay

## 2019-06-13 ENCOUNTER — Ambulatory Visit
Admission: RE | Admit: 2019-06-13 | Discharge: 2019-06-13 | Disposition: A | Discharge status: Home / Self Care | Payer: BC Managed Care – PPO | Source: Ambulatory Visit

## 2019-06-13 DIAGNOSIS — Z1231 Encounter for screening mammogram for malignant neoplasm of breast: Secondary | ICD-10-CM

## 2019-06-20 DIAGNOSIS — J301 Allergic rhinitis due to pollen: Secondary | ICD-10-CM | POA: Diagnosis not present

## 2019-06-20 DIAGNOSIS — J45990 Exercise induced bronchospasm: Secondary | ICD-10-CM | POA: Diagnosis not present

## 2019-06-20 DIAGNOSIS — J3089 Other allergic rhinitis: Secondary | ICD-10-CM | POA: Diagnosis not present

## 2019-08-14 DIAGNOSIS — R87610 Atypical squamous cells of undetermined significance on cytologic smear of cervix (ASC-US): Secondary | ICD-10-CM | POA: Diagnosis not present

## 2019-08-14 DIAGNOSIS — Z01411 Encounter for gynecological examination (general) (routine) with abnormal findings: Secondary | ICD-10-CM | POA: Diagnosis not present

## 2019-08-14 DIAGNOSIS — R102 Pelvic and perineal pain: Secondary | ICD-10-CM | POA: Diagnosis not present

## 2019-08-14 DIAGNOSIS — N941 Unspecified dyspareunia: Secondary | ICD-10-CM | POA: Diagnosis not present

## 2019-08-16 ENCOUNTER — Other Ambulatory Visit: Payer: Self-pay | Admitting: Physician Assistant

## 2019-08-16 DIAGNOSIS — R1031 Right lower quadrant pain: Secondary | ICD-10-CM

## 2019-08-16 DIAGNOSIS — R143 Flatulence: Secondary | ICD-10-CM | POA: Diagnosis not present

## 2019-08-16 DIAGNOSIS — R140 Abdominal distension (gaseous): Secondary | ICD-10-CM | POA: Diagnosis not present

## 2019-08-21 ENCOUNTER — Other Ambulatory Visit: Payer: Self-pay | Admitting: Physician Assistant

## 2019-08-22 ENCOUNTER — Ambulatory Visit
Admission: RE | Admit: 2019-08-22 | Discharge: 2019-08-22 | Disposition: A | Discharge status: Home / Self Care | Payer: BC Managed Care – PPO | Source: Ambulatory Visit | Attending: Physician Assistant | Admitting: Physician Assistant

## 2019-08-22 ENCOUNTER — Other Ambulatory Visit: Payer: Self-pay

## 2019-08-22 DIAGNOSIS — E278 Other specified disorders of adrenal gland: Secondary | ICD-10-CM | POA: Diagnosis not present

## 2019-08-22 DIAGNOSIS — R1031 Right lower quadrant pain: Secondary | ICD-10-CM

## 2019-08-22 MED ORDER — IOPAMIDOL (ISOVUE-300) INJECTION 61%
100.0000 mL | Freq: Once | INTRAVENOUS | Status: AC | PRN
  Administered 2019-08-22: 100 mL via INTRAVENOUS

## 2019-08-27 DIAGNOSIS — R1031 Right lower quadrant pain: Secondary | ICD-10-CM | POA: Diagnosis not present

## 2019-09-27 DIAGNOSIS — Z1159 Encounter for screening for other viral diseases: Secondary | ICD-10-CM | POA: Diagnosis not present

## 2019-10-02 DIAGNOSIS — R933 Abnormal findings on diagnostic imaging of other parts of digestive tract: Secondary | ICD-10-CM | POA: Diagnosis not present

## 2019-10-02 DIAGNOSIS — R109 Unspecified abdominal pain: Secondary | ICD-10-CM | POA: Diagnosis not present

## 2019-10-20 DIAGNOSIS — J069 Acute upper respiratory infection, unspecified: Secondary | ICD-10-CM | POA: Diagnosis not present

## 2019-10-20 DIAGNOSIS — Z20828 Contact with and (suspected) exposure to other viral communicable diseases: Secondary | ICD-10-CM | POA: Diagnosis not present

## 2019-10-20 DIAGNOSIS — R05 Cough: Secondary | ICD-10-CM | POA: Diagnosis not present

## 2020-01-13 DIAGNOSIS — K5900 Constipation, unspecified: Secondary | ICD-10-CM | POA: Diagnosis not present

## 2020-01-13 DIAGNOSIS — R1031 Right lower quadrant pain: Secondary | ICD-10-CM | POA: Diagnosis not present

## 2020-01-13 DIAGNOSIS — R140 Abdominal distension (gaseous): Secondary | ICD-10-CM | POA: Diagnosis not present

## 2020-01-13 DIAGNOSIS — R143 Flatulence: Secondary | ICD-10-CM | POA: Diagnosis not present

## 2020-01-27 DIAGNOSIS — M542 Cervicalgia: Secondary | ICD-10-CM | POA: Diagnosis not present

## 2020-01-27 DIAGNOSIS — M5412 Radiculopathy, cervical region: Secondary | ICD-10-CM | POA: Diagnosis not present

## 2020-02-07 DIAGNOSIS — M5412 Radiculopathy, cervical region: Secondary | ICD-10-CM | POA: Diagnosis not present

## 2020-02-14 DIAGNOSIS — M5412 Radiculopathy, cervical region: Secondary | ICD-10-CM | POA: Diagnosis not present

## 2020-02-14 DIAGNOSIS — M542 Cervicalgia: Secondary | ICD-10-CM | POA: Diagnosis not present

## 2020-02-17 ENCOUNTER — Other Ambulatory Visit: Payer: Self-pay | Admitting: Orthopedic Surgery

## 2020-02-17 DIAGNOSIS — M5412 Radiculopathy, cervical region: Secondary | ICD-10-CM

## 2020-02-25 ENCOUNTER — Inpatient Hospital Stay: Admission: RE | Admit: 2020-02-25 | Payer: BC Managed Care – PPO | Source: Ambulatory Visit

## 2020-02-25 ENCOUNTER — Ambulatory Visit
Admission: RE | Admit: 2020-02-25 | Discharge: 2020-02-25 | Disposition: A | Discharge status: Home / Self Care | Payer: BC Managed Care – PPO | Source: Ambulatory Visit | Attending: Orthopedic Surgery | Admitting: Orthopedic Surgery

## 2020-02-25 DIAGNOSIS — M5412 Radiculopathy, cervical region: Secondary | ICD-10-CM

## 2020-02-25 DIAGNOSIS — M542 Cervicalgia: Secondary | ICD-10-CM | POA: Insufficient documentation

## 2020-02-25 DIAGNOSIS — M4722 Other spondylosis with radiculopathy, cervical region: Secondary | ICD-10-CM | POA: Diagnosis not present

## 2020-02-25 MED ORDER — IOPAMIDOL (ISOVUE-M 300) INJECTION 61%
1.0000 mL | Freq: Once | INTRAMUSCULAR | Status: AC | PRN
  Administered 2020-02-25: 1 mL via EPIDURAL

## 2020-02-25 MED ORDER — TRIAMCINOLONE ACETONIDE 40 MG/ML IJ SUSP (RADIOLOGY)
60.0000 mg | Freq: Once | INTRAMUSCULAR | Status: AC
  Administered 2020-02-25: 60 mg via EPIDURAL

## 2020-02-25 NOTE — Discharge Instructions (Signed)

## 2020-03-03 DIAGNOSIS — D3500 Benign neoplasm of unspecified adrenal gland: Secondary | ICD-10-CM | POA: Diagnosis not present

## 2020-03-25 DIAGNOSIS — I83893 Varicose veins of bilateral lower extremities with other complications: Secondary | ICD-10-CM | POA: Diagnosis not present

## 2020-03-25 DIAGNOSIS — I83813 Varicose veins of bilateral lower extremities with pain: Secondary | ICD-10-CM | POA: Diagnosis not present

## 2020-03-25 DIAGNOSIS — I8312 Varicose veins of left lower extremity with inflammation: Secondary | ICD-10-CM | POA: Diagnosis not present

## 2020-03-25 DIAGNOSIS — I8311 Varicose veins of right lower extremity with inflammation: Secondary | ICD-10-CM | POA: Diagnosis not present

## 2020-03-31 DIAGNOSIS — I8312 Varicose veins of left lower extremity with inflammation: Secondary | ICD-10-CM | POA: Diagnosis not present

## 2020-03-31 DIAGNOSIS — I8311 Varicose veins of right lower extremity with inflammation: Secondary | ICD-10-CM | POA: Diagnosis not present

## 2020-04-21 DIAGNOSIS — M542 Cervicalgia: Secondary | ICD-10-CM | POA: Diagnosis not present

## 2020-04-23 DIAGNOSIS — D3502 Benign neoplasm of left adrenal gland: Secondary | ICD-10-CM | POA: Diagnosis not present

## 2020-04-24 ENCOUNTER — Other Ambulatory Visit: Payer: Self-pay | Admitting: Endocrinology

## 2020-04-24 DIAGNOSIS — D3502 Benign neoplasm of left adrenal gland: Secondary | ICD-10-CM

## 2020-05-06 DIAGNOSIS — M5412 Radiculopathy, cervical region: Secondary | ICD-10-CM | POA: Diagnosis not present

## 2020-05-06 DIAGNOSIS — D3502 Benign neoplasm of left adrenal gland: Secondary | ICD-10-CM | POA: Diagnosis not present

## 2020-05-19 ENCOUNTER — Other Ambulatory Visit: Payer: Self-pay | Admitting: Endocrinology

## 2020-05-19 DIAGNOSIS — M5412 Radiculopathy, cervical region: Secondary | ICD-10-CM | POA: Diagnosis not present

## 2020-05-19 DIAGNOSIS — E230 Hypopituitarism: Secondary | ICD-10-CM

## 2020-05-21 DIAGNOSIS — M5412 Radiculopathy, cervical region: Secondary | ICD-10-CM | POA: Diagnosis not present

## 2020-05-26 DIAGNOSIS — M542 Cervicalgia: Secondary | ICD-10-CM | POA: Diagnosis not present

## 2020-05-29 DIAGNOSIS — M5412 Radiculopathy, cervical region: Secondary | ICD-10-CM | POA: Diagnosis not present

## 2020-06-01 DIAGNOSIS — M5412 Radiculopathy, cervical region: Secondary | ICD-10-CM | POA: Diagnosis not present

## 2020-06-02 ENCOUNTER — Other Ambulatory Visit: Payer: Self-pay | Admitting: Family Medicine

## 2020-06-02 DIAGNOSIS — Z1231 Encounter for screening mammogram for malignant neoplasm of breast: Secondary | ICD-10-CM

## 2020-06-02 DIAGNOSIS — M549 Dorsalgia, unspecified: Secondary | ICD-10-CM | POA: Diagnosis not present

## 2020-06-09 DIAGNOSIS — K219 Gastro-esophageal reflux disease without esophagitis: Secondary | ICD-10-CM | POA: Diagnosis not present

## 2020-06-13 ENCOUNTER — Ambulatory Visit
Admission: RE | Admit: 2020-06-13 | Discharge: 2020-06-13 | Disposition: A | Discharge status: Home / Self Care | Payer: BC Managed Care – PPO | Source: Ambulatory Visit | Attending: Endocrinology | Admitting: Endocrinology

## 2020-06-13 ENCOUNTER — Other Ambulatory Visit: Payer: Self-pay

## 2020-06-13 DIAGNOSIS — E230 Hypopituitarism: Secondary | ICD-10-CM

## 2020-06-13 MED ORDER — GADOBENATE DIMEGLUMINE 529 MG/ML IV SOLN
11.0000 mL | Freq: Once | INTRAVENOUS | Status: AC | PRN
  Administered 2020-06-13: 11 mL via INTRAVENOUS

## 2020-06-15 ENCOUNTER — Ambulatory Visit: Payer: BC Managed Care – PPO

## 2020-07-08 DIAGNOSIS — D3502 Benign neoplasm of left adrenal gland: Secondary | ICD-10-CM | POA: Diagnosis not present

## 2020-08-03 ENCOUNTER — Other Ambulatory Visit: Payer: BC Managed Care – PPO

## 2020-08-04 DIAGNOSIS — M542 Cervicalgia: Secondary | ICD-10-CM | POA: Diagnosis not present

## 2020-08-04 DIAGNOSIS — M5412 Radiculopathy, cervical region: Secondary | ICD-10-CM | POA: Diagnosis not present

## 2020-08-07 DIAGNOSIS — Z789 Other specified health status: Secondary | ICD-10-CM | POA: Diagnosis not present

## 2020-08-07 DIAGNOSIS — R140 Abdominal distension (gaseous): Secondary | ICD-10-CM | POA: Diagnosis not present

## 2020-08-07 DIAGNOSIS — D497 Neoplasm of unspecified behavior of endocrine glands and other parts of nervous system: Secondary | ICD-10-CM | POA: Diagnosis not present

## 2020-08-07 DIAGNOSIS — K30 Functional dyspepsia: Secondary | ICD-10-CM | POA: Diagnosis not present

## 2020-08-07 DIAGNOSIS — E538 Deficiency of other specified B group vitamins: Secondary | ICD-10-CM | POA: Diagnosis not present

## 2020-08-07 DIAGNOSIS — Z1329 Encounter for screening for other suspected endocrine disorder: Secondary | ICD-10-CM | POA: Diagnosis not present

## 2020-08-07 DIAGNOSIS — E559 Vitamin D deficiency, unspecified: Secondary | ICD-10-CM | POA: Diagnosis not present

## 2020-08-07 DIAGNOSIS — Z131 Encounter for screening for diabetes mellitus: Secondary | ICD-10-CM | POA: Diagnosis not present

## 2020-08-07 DIAGNOSIS — G4700 Insomnia, unspecified: Secondary | ICD-10-CM | POA: Diagnosis not present

## 2020-08-07 DIAGNOSIS — R7989 Other specified abnormal findings of blood chemistry: Secondary | ICD-10-CM | POA: Diagnosis not present

## 2020-08-12 DIAGNOSIS — J45990 Exercise induced bronchospasm: Secondary | ICD-10-CM | POA: Diagnosis not present

## 2020-08-12 DIAGNOSIS — J3089 Other allergic rhinitis: Secondary | ICD-10-CM | POA: Diagnosis not present

## 2020-08-12 DIAGNOSIS — J301 Allergic rhinitis due to pollen: Secondary | ICD-10-CM | POA: Diagnosis not present

## 2020-08-18 ENCOUNTER — Other Ambulatory Visit: Payer: Self-pay | Admitting: Physician Assistant

## 2020-08-18 DIAGNOSIS — D3502 Benign neoplasm of left adrenal gland: Secondary | ICD-10-CM

## 2020-08-18 DIAGNOSIS — E278 Other specified disorders of adrenal gland: Secondary | ICD-10-CM

## 2020-08-19 DIAGNOSIS — M25562 Pain in left knee: Secondary | ICD-10-CM | POA: Diagnosis not present

## 2020-08-21 DIAGNOSIS — Z1329 Encounter for screening for other suspected endocrine disorder: Secondary | ICD-10-CM | POA: Diagnosis not present

## 2020-08-21 DIAGNOSIS — Z131 Encounter for screening for diabetes mellitus: Secondary | ICD-10-CM | POA: Diagnosis not present

## 2020-08-21 DIAGNOSIS — R7989 Other specified abnormal findings of blood chemistry: Secondary | ICD-10-CM | POA: Diagnosis not present

## 2020-08-21 DIAGNOSIS — G4700 Insomnia, unspecified: Secondary | ICD-10-CM | POA: Diagnosis not present

## 2020-09-03 DIAGNOSIS — E538 Deficiency of other specified B group vitamins: Secondary | ICD-10-CM | POA: Diagnosis not present

## 2020-09-03 DIAGNOSIS — K30 Functional dyspepsia: Secondary | ICD-10-CM | POA: Diagnosis not present

## 2020-09-03 DIAGNOSIS — R10819 Abdominal tenderness, unspecified site: Secondary | ICD-10-CM | POA: Diagnosis not present

## 2020-09-03 DIAGNOSIS — R7989 Other specified abnormal findings of blood chemistry: Secondary | ICD-10-CM | POA: Diagnosis not present

## 2020-09-09 ENCOUNTER — Other Ambulatory Visit: Payer: BC Managed Care – PPO

## 2020-09-10 ENCOUNTER — Ambulatory Visit
Admission: RE | Admit: 2020-09-10 | Discharge: 2020-09-10 | Disposition: A | Discharge status: Home / Self Care | Payer: BC Managed Care – PPO | Source: Ambulatory Visit | Attending: Endocrinology | Admitting: Endocrinology

## 2020-09-10 DIAGNOSIS — D3502 Benign neoplasm of left adrenal gland: Secondary | ICD-10-CM | POA: Diagnosis not present

## 2020-09-10 DIAGNOSIS — E278 Other specified disorders of adrenal gland: Secondary | ICD-10-CM | POA: Diagnosis not present

## 2020-09-10 DIAGNOSIS — M25562 Pain in left knee: Secondary | ICD-10-CM | POA: Diagnosis not present

## 2020-09-10 MED ORDER — IOPAMIDOL (ISOVUE-300) INJECTION 61%
100.0000 mL | Freq: Once | INTRAVENOUS | Status: AC | PRN
  Administered 2020-09-10: 100 mL via INTRAVENOUS

## 2020-09-16 DIAGNOSIS — M25562 Pain in left knee: Secondary | ICD-10-CM | POA: Diagnosis not present

## 2020-09-23 ENCOUNTER — Other Ambulatory Visit: Payer: Self-pay | Admitting: Endocrinology

## 2020-09-23 DIAGNOSIS — D3502 Benign neoplasm of left adrenal gland: Secondary | ICD-10-CM | POA: Diagnosis not present

## 2020-09-29 ENCOUNTER — Other Ambulatory Visit: Payer: BC Managed Care – PPO

## 2020-10-07 DIAGNOSIS — M25562 Pain in left knee: Secondary | ICD-10-CM | POA: Diagnosis not present

## 2020-10-13 ENCOUNTER — Other Ambulatory Visit: Payer: BC Managed Care – PPO

## 2021-01-20 DIAGNOSIS — D3502 Benign neoplasm of left adrenal gland: Secondary | ICD-10-CM | POA: Diagnosis not present

## 2021-01-20 DIAGNOSIS — E230 Hypopituitarism: Secondary | ICD-10-CM | POA: Diagnosis not present

## 2021-01-25 DIAGNOSIS — E230 Hypopituitarism: Secondary | ICD-10-CM | POA: Diagnosis not present

## 2021-01-25 DIAGNOSIS — D3502 Benign neoplasm of left adrenal gland: Secondary | ICD-10-CM | POA: Diagnosis not present

## 2021-01-25 DIAGNOSIS — R002 Palpitations: Secondary | ICD-10-CM | POA: Diagnosis not present

## 2021-01-29 DIAGNOSIS — D3502 Benign neoplasm of left adrenal gland: Secondary | ICD-10-CM | POA: Diagnosis not present

## 2021-02-03 DIAGNOSIS — D3502 Benign neoplasm of left adrenal gland: Secondary | ICD-10-CM | POA: Diagnosis not present

## 2021-03-25 DIAGNOSIS — D3502 Benign neoplasm of left adrenal gland: Secondary | ICD-10-CM | POA: Diagnosis not present

## 2021-03-25 DIAGNOSIS — E230 Hypopituitarism: Secondary | ICD-10-CM | POA: Diagnosis not present

## 2021-03-29 ENCOUNTER — Other Ambulatory Visit: Payer: Self-pay

## 2021-03-29 ENCOUNTER — Ambulatory Visit
Admission: RE | Admit: 2021-03-29 | Discharge: 2021-03-29 | Disposition: A | Discharge status: Home / Self Care | Payer: BC Managed Care – PPO | Source: Ambulatory Visit | Attending: Endocrinology | Admitting: Endocrinology

## 2021-03-29 DIAGNOSIS — D3502 Benign neoplasm of left adrenal gland: Secondary | ICD-10-CM | POA: Diagnosis not present

## 2021-08-24 DIAGNOSIS — J3089 Other allergic rhinitis: Secondary | ICD-10-CM | POA: Diagnosis not present

## 2021-08-24 DIAGNOSIS — J45990 Exercise induced bronchospasm: Secondary | ICD-10-CM | POA: Diagnosis not present

## 2021-08-24 DIAGNOSIS — J301 Allergic rhinitis due to pollen: Secondary | ICD-10-CM | POA: Diagnosis not present

## 2021-11-27 IMAGING — XA DG INJECT/[PERSON_NAME] INC NEEDLE/CATH/PLC EPI/CERV/THOR W/IMG
2 series · 2 of 2 positions shown · non-contrast
Comparison: none

CLINICAL DATA: Cervical spondylosis without myelopathy. Left upper
extremity radiculopathy.

[Series 1: ortho standard · 1 of 1 slices shown (1 of 2)]
[im 1/1]
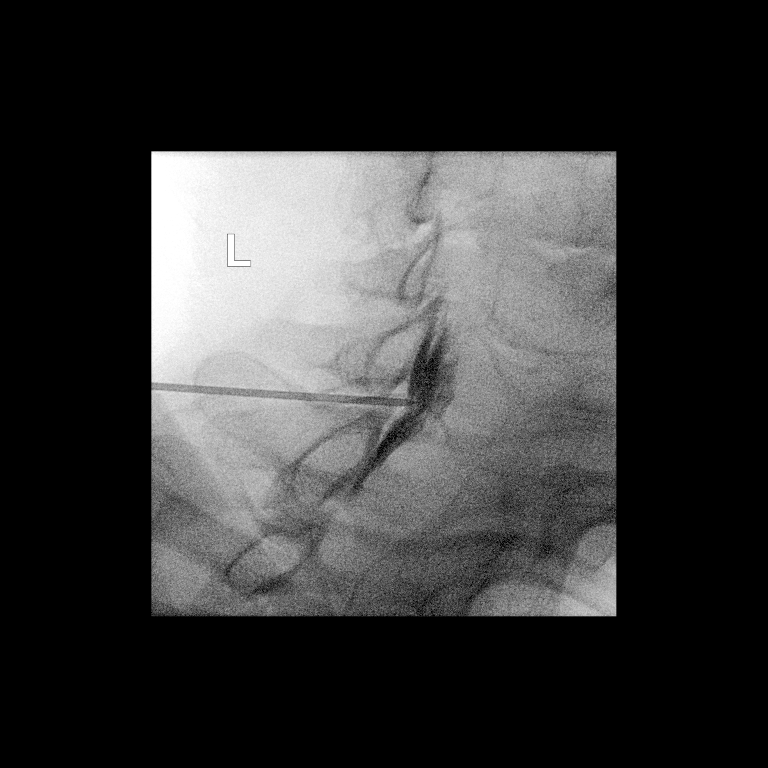

[Series 2: ortho standard · 1 of 1 slices shown (2 of 2)]
[im 1/1]
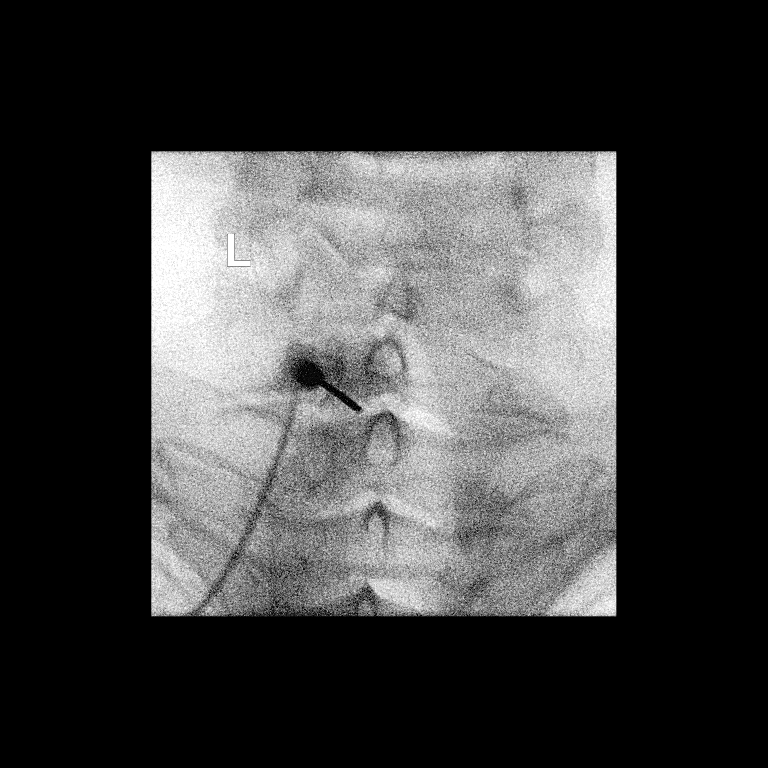

[2 of 2 positions shown; findings below may reference images not displayed]

FLUOROSCOPY TIME:  Fluoroscopy Time: 24 seconds

Radiation Exposure Index: 4.00 microGray*m^2

PROCEDURE:
The procedure, risks, benefits, and alternatives were explained to
the patient. Questions regarding the procedure were encouraged and
answered. The patient understands and consents to the procedure.

CERVICAL EPIDURAL INJECTION

An interlaminar approach was performed on the left at C7-T1. A
inch 20 gauge epidural needle was advanced using loss-of-resistance
technique.

DIAGNOSTIC EPIDURAL INJECTION

Injection of Isovue-M 300 shows a good epidural pattern with spread
above and below the level of needle placement, primarily on the
left. No vascular opacification is seen.

THERAPEUTIC EPIDURAL INJECTION

1.5 ml of Kenalog 40 mixed with 2 ml of normal saline were then
instilled. The procedure was well-tolerated, and the patient was
discharged thirty minutes following the injection in good condition.
IMPRESSION: Technically successful interlaminar epidural injection on the left
at C7-T1.

## 2022-03-24 DIAGNOSIS — D3502 Benign neoplasm of left adrenal gland: Secondary | ICD-10-CM | POA: Diagnosis not present

## 2022-03-24 DIAGNOSIS — E230 Hypopituitarism: Secondary | ICD-10-CM | POA: Diagnosis not present

## 2022-03-24 DIAGNOSIS — R002 Palpitations: Secondary | ICD-10-CM | POA: Diagnosis not present

## 2022-03-28 DIAGNOSIS — D3502 Benign neoplasm of left adrenal gland: Secondary | ICD-10-CM | POA: Diagnosis not present

## 2022-03-29 DIAGNOSIS — D3502 Benign neoplasm of left adrenal gland: Secondary | ICD-10-CM | POA: Diagnosis not present

## 2022-05-03 DIAGNOSIS — D3502 Benign neoplasm of left adrenal gland: Secondary | ICD-10-CM | POA: Diagnosis not present

## 2022-06-14 DIAGNOSIS — J324 Chronic pansinusitis: Secondary | ICD-10-CM | POA: Diagnosis not present

## 2022-06-14 DIAGNOSIS — R439 Unspecified disturbances of smell and taste: Secondary | ICD-10-CM | POA: Diagnosis not present

## 2022-07-20 DIAGNOSIS — J324 Chronic pansinusitis: Secondary | ICD-10-CM | POA: Diagnosis not present

## 2022-08-04 DIAGNOSIS — J45990 Exercise induced bronchospasm: Secondary | ICD-10-CM | POA: Diagnosis not present

## 2022-08-04 DIAGNOSIS — J019 Acute sinusitis, unspecified: Secondary | ICD-10-CM | POA: Diagnosis not present

## 2022-08-04 DIAGNOSIS — J301 Allergic rhinitis due to pollen: Secondary | ICD-10-CM | POA: Diagnosis not present

## 2022-08-04 DIAGNOSIS — J3089 Other allergic rhinitis: Secondary | ICD-10-CM | POA: Diagnosis not present

## 2022-08-31 DIAGNOSIS — J4541 Moderate persistent asthma with (acute) exacerbation: Secondary | ICD-10-CM | POA: Diagnosis not present

## 2022-10-05 ENCOUNTER — Other Ambulatory Visit: Payer: Self-pay | Admitting: Family Medicine

## 2022-10-05 DIAGNOSIS — Z1231 Encounter for screening mammogram for malignant neoplasm of breast: Secondary | ICD-10-CM

## 2022-11-01 DIAGNOSIS — Z13220 Encounter for screening for lipoid disorders: Secondary | ICD-10-CM | POA: Diagnosis not present

## 2022-11-01 DIAGNOSIS — Z1389 Encounter for screening for other disorder: Secondary | ICD-10-CM | POA: Diagnosis not present

## 2022-11-01 DIAGNOSIS — Z0000 Encounter for general adult medical examination without abnormal findings: Secondary | ICD-10-CM | POA: Diagnosis not present

## 2022-11-15 DIAGNOSIS — Z13220 Encounter for screening for lipoid disorders: Secondary | ICD-10-CM | POA: Diagnosis not present

## 2022-11-28 ENCOUNTER — Ambulatory Visit
Admission: RE | Admit: 2022-11-28 | Discharge: 2022-11-28 | Disposition: A | Discharge status: Home / Self Care | Payer: BC Managed Care – PPO | Source: Ambulatory Visit

## 2022-11-28 DIAGNOSIS — Z1231 Encounter for screening mammogram for malignant neoplasm of breast: Secondary | ICD-10-CM | POA: Diagnosis not present

## 2023-04-07 DIAGNOSIS — J3089 Other allergic rhinitis: Secondary | ICD-10-CM | POA: Diagnosis not present

## 2023-04-07 DIAGNOSIS — J45990 Exercise induced bronchospasm: Secondary | ICD-10-CM | POA: Diagnosis not present

## 2023-04-07 DIAGNOSIS — J45998 Other asthma: Secondary | ICD-10-CM | POA: Diagnosis not present

## 2023-04-07 DIAGNOSIS — J019 Acute sinusitis, unspecified: Secondary | ICD-10-CM | POA: Diagnosis not present

## 2023-04-07 DIAGNOSIS — J301 Allergic rhinitis due to pollen: Secondary | ICD-10-CM | POA: Diagnosis not present

## 2023-04-13 DIAGNOSIS — J019 Acute sinusitis, unspecified: Secondary | ICD-10-CM | POA: Diagnosis not present

## 2023-05-24 DIAGNOSIS — Z01419 Encounter for gynecological examination (general) (routine) without abnormal findings: Secondary | ICD-10-CM | POA: Diagnosis not present

## 2023-05-24 DIAGNOSIS — Z124 Encounter for screening for malignant neoplasm of cervix: Secondary | ICD-10-CM | POA: Diagnosis not present

## 2023-05-24 DIAGNOSIS — N952 Postmenopausal atrophic vaginitis: Secondary | ICD-10-CM | POA: Diagnosis not present

## 2023-05-24 DIAGNOSIS — Z1151 Encounter for screening for human papillomavirus (HPV): Secondary | ICD-10-CM | POA: Diagnosis not present

## 2023-09-07 DIAGNOSIS — J301 Allergic rhinitis due to pollen: Secondary | ICD-10-CM | POA: Diagnosis not present

## 2023-09-07 DIAGNOSIS — J45990 Exercise induced bronchospasm: Secondary | ICD-10-CM | POA: Diagnosis not present

## 2023-09-07 DIAGNOSIS — J3089 Other allergic rhinitis: Secondary | ICD-10-CM | POA: Diagnosis not present

## 2023-09-07 DIAGNOSIS — L710 Perioral dermatitis: Secondary | ICD-10-CM | POA: Diagnosis not present

## 2023-09-25 DIAGNOSIS — K580 Irritable bowel syndrome with diarrhea: Secondary | ICD-10-CM | POA: Diagnosis not present

## 2023-09-25 DIAGNOSIS — R10819 Abdominal tenderness, unspecified site: Secondary | ICD-10-CM | POA: Diagnosis not present

## 2023-09-25 DIAGNOSIS — E559 Vitamin D deficiency, unspecified: Secondary | ICD-10-CM | POA: Diagnosis not present

## 2023-09-25 DIAGNOSIS — R140 Abdominal distension (gaseous): Secondary | ICD-10-CM | POA: Diagnosis not present

## 2023-09-25 DIAGNOSIS — R7989 Other specified abnormal findings of blood chemistry: Secondary | ICD-10-CM | POA: Diagnosis not present

## 2023-10-31 DIAGNOSIS — K580 Irritable bowel syndrome with diarrhea: Secondary | ICD-10-CM | POA: Diagnosis not present

## 2023-10-31 DIAGNOSIS — R140 Abdominal distension (gaseous): Secondary | ICD-10-CM | POA: Diagnosis not present

## 2023-10-31 DIAGNOSIS — R10819 Abdominal tenderness, unspecified site: Secondary | ICD-10-CM | POA: Diagnosis not present

## 2023-11-27 DIAGNOSIS — Z1389 Encounter for screening for other disorder: Secondary | ICD-10-CM | POA: Diagnosis not present

## 2023-11-27 DIAGNOSIS — Z13220 Encounter for screening for lipoid disorders: Secondary | ICD-10-CM | POA: Diagnosis not present

## 2023-11-27 DIAGNOSIS — Z0000 Encounter for general adult medical examination without abnormal findings: Secondary | ICD-10-CM | POA: Diagnosis not present

## 2024-01-01 DIAGNOSIS — D497 Neoplasm of unspecified behavior of endocrine glands and other parts of nervous system: Secondary | ICD-10-CM | POA: Diagnosis not present

## 2024-01-01 DIAGNOSIS — A045 Campylobacter enteritis: Secondary | ICD-10-CM | POA: Diagnosis not present

## 2024-01-01 DIAGNOSIS — K580 Irritable bowel syndrome with diarrhea: Secondary | ICD-10-CM | POA: Diagnosis not present

## 2024-01-01 DIAGNOSIS — A0472 Enterocolitis due to Clostridium difficile, not specified as recurrent: Secondary | ICD-10-CM | POA: Diagnosis not present

## 2024-01-02 DIAGNOSIS — A045 Campylobacter enteritis: Secondary | ICD-10-CM | POA: Diagnosis not present

## 2024-01-02 DIAGNOSIS — K580 Irritable bowel syndrome with diarrhea: Secondary | ICD-10-CM | POA: Diagnosis not present

## 2024-01-02 DIAGNOSIS — A0472 Enterocolitis due to Clostridium difficile, not specified as recurrent: Secondary | ICD-10-CM | POA: Diagnosis not present

## 2024-01-02 DIAGNOSIS — D497 Neoplasm of unspecified behavior of endocrine glands and other parts of nervous system: Secondary | ICD-10-CM | POA: Diagnosis not present

## 2024-01-26 DIAGNOSIS — R10819 Abdominal tenderness, unspecified site: Secondary | ICD-10-CM | POA: Diagnosis not present

## 2024-01-26 DIAGNOSIS — R140 Abdominal distension (gaseous): Secondary | ICD-10-CM | POA: Diagnosis not present

## 2024-01-26 DIAGNOSIS — A0472 Enterocolitis due to Clostridium difficile, not specified as recurrent: Secondary | ICD-10-CM | POA: Diagnosis not present

## 2024-01-26 DIAGNOSIS — K580 Irritable bowel syndrome with diarrhea: Secondary | ICD-10-CM | POA: Diagnosis not present

## 2024-04-01 DIAGNOSIS — Z780 Asymptomatic menopausal state: Secondary | ICD-10-CM | POA: Diagnosis not present

## 2024-04-01 DIAGNOSIS — Z7951 Long term (current) use of inhaled steroids: Secondary | ICD-10-CM | POA: Diagnosis not present

## 2024-04-01 DIAGNOSIS — D3502 Benign neoplasm of left adrenal gland: Secondary | ICD-10-CM | POA: Diagnosis not present

## 2024-04-03 ENCOUNTER — Other Ambulatory Visit: Payer: Self-pay | Admitting: Endocrinology

## 2024-04-03 DIAGNOSIS — D3502 Benign neoplasm of left adrenal gland: Secondary | ICD-10-CM

## 2024-04-16 ENCOUNTER — Ambulatory Visit
Admission: RE | Admit: 2024-04-16 | Discharge: 2024-04-16 | Disposition: A | Discharge status: Home / Self Care | Source: Ambulatory Visit | Attending: Endocrinology | Admitting: Endocrinology

## 2024-04-16 DIAGNOSIS — D3502 Benign neoplasm of left adrenal gland: Secondary | ICD-10-CM

## 2024-04-16 MED ORDER — IOPAMIDOL (ISOVUE-300) INJECTION 61%
100.0000 mL | Freq: Once | INTRAVENOUS | Status: AC | PRN
  Administered 2024-04-16: 100 mL via INTRAVENOUS

## 2024-05-29 DIAGNOSIS — Z01419 Encounter for gynecological examination (general) (routine) without abnormal findings: Secondary | ICD-10-CM | POA: Diagnosis not present

## 2024-06-07 DIAGNOSIS — K580 Irritable bowel syndrome with diarrhea: Secondary | ICD-10-CM | POA: Diagnosis not present

## 2024-06-07 DIAGNOSIS — R10819 Abdominal tenderness, unspecified site: Secondary | ICD-10-CM | POA: Diagnosis not present

## 2024-06-07 DIAGNOSIS — R140 Abdominal distension (gaseous): Secondary | ICD-10-CM | POA: Diagnosis not present

## 2024-06-07 DIAGNOSIS — A0472 Enterocolitis due to Clostridium difficile, not specified as recurrent: Secondary | ICD-10-CM | POA: Diagnosis not present

## 2024-06-12 DIAGNOSIS — A0472 Enterocolitis due to Clostridium difficile, not specified as recurrent: Secondary | ICD-10-CM | POA: Diagnosis not present

## 2024-06-12 DIAGNOSIS — K580 Irritable bowel syndrome with diarrhea: Secondary | ICD-10-CM | POA: Diagnosis not present

## 2024-06-12 DIAGNOSIS — A045 Campylobacter enteritis: Secondary | ICD-10-CM | POA: Diagnosis not present

## 2024-06-12 DIAGNOSIS — R140 Abdominal distension (gaseous): Secondary | ICD-10-CM | POA: Diagnosis not present

## 2024-07-22 DIAGNOSIS — R140 Abdominal distension (gaseous): Secondary | ICD-10-CM | POA: Diagnosis not present

## 2024-07-22 DIAGNOSIS — R10819 Abdominal tenderness, unspecified site: Secondary | ICD-10-CM | POA: Diagnosis not present

## 2024-07-22 DIAGNOSIS — K580 Irritable bowel syndrome with diarrhea: Secondary | ICD-10-CM | POA: Diagnosis not present

## 2024-08-20 ENCOUNTER — Other Ambulatory Visit: Payer: Self-pay | Admitting: Family Medicine

## 2024-08-20 DIAGNOSIS — R918 Other nonspecific abnormal finding of lung field: Secondary | ICD-10-CM

## 2024-08-28 ENCOUNTER — Ambulatory Visit
Admission: RE | Admit: 2024-08-28 | Discharge: 2024-08-28 | Disposition: A | Discharge status: Home / Self Care | Source: Ambulatory Visit | Attending: Family Medicine | Admitting: Family Medicine

## 2024-08-28 DIAGNOSIS — R918 Other nonspecific abnormal finding of lung field: Secondary | ICD-10-CM
